# Patient Record
Sex: Female | Born: 1960 | Race: Black or African American | Hispanic: No | Marital: Single | Smoking: Current every day smoker
Health system: Southern US, Community
[De-identification: ages and names within clinical notes are randomized; demographics above are authoritative.]

## PROBLEM LIST (undated history)

## (undated) DIAGNOSIS — J349 Unspecified disorder of nose and nasal sinuses: Secondary | ICD-10-CM

## (undated) DIAGNOSIS — E785 Hyperlipidemia, unspecified: Secondary | ICD-10-CM

## (undated) DIAGNOSIS — I10 Essential (primary) hypertension: Secondary | ICD-10-CM

## (undated) HISTORY — DX: Unspecified disorder of nose and nasal sinuses: J34.9

## (undated) HISTORY — DX: Essential (primary) hypertension: I10

## (undated) HISTORY — DX: Hyperlipidemia, unspecified: E78.5

## (undated) HISTORY — PX: KNEE ARTHROSCOPY: SUR90

---

## 1999-03-09 HISTORY — PX: CHOLECYSTECTOMY: SHX55

## 2005-02-22 ENCOUNTER — Other Ambulatory Visit: Payer: Self-pay

## 2005-02-25 ENCOUNTER — Ambulatory Visit: Payer: Self-pay | Admitting: Unknown Physician Specialty

## 2005-02-27 ENCOUNTER — Emergency Department: Payer: Self-pay | Admitting: Unknown Physician Specialty

## 2011-08-23 ENCOUNTER — Encounter: Payer: Self-pay | Admitting: Family Medicine

## 2011-08-23 ENCOUNTER — Ambulatory Visit (INDEPENDENT_AMBULATORY_CARE_PROVIDER_SITE_OTHER): Payer: BC Managed Care – PPO | Admitting: Family Medicine

## 2011-08-23 VITALS — BP 132/80 | HR 77 | Resp 18 | Ht 61.0 in | Wt 196.0 lb

## 2011-08-23 DIAGNOSIS — E785 Hyperlipidemia, unspecified: Secondary | ICD-10-CM

## 2011-08-23 DIAGNOSIS — Z130 Encounter for screening for diseases of the blood and blood-forming organs and certain disorders involving the immune mechanism: Secondary | ICD-10-CM

## 2011-08-23 DIAGNOSIS — Z1321 Encounter for screening for nutritional disorder: Secondary | ICD-10-CM

## 2011-08-23 DIAGNOSIS — F17200 Nicotine dependence, unspecified, uncomplicated: Secondary | ICD-10-CM

## 2011-08-23 DIAGNOSIS — E669 Obesity, unspecified: Secondary | ICD-10-CM

## 2011-08-23 DIAGNOSIS — I83893 Varicose veins of bilateral lower extremities with other complications: Secondary | ICD-10-CM

## 2011-08-23 DIAGNOSIS — M79606 Pain in leg, unspecified: Secondary | ICD-10-CM

## 2011-08-23 DIAGNOSIS — Z13228 Encounter for screening for other metabolic disorders: Secondary | ICD-10-CM

## 2011-08-23 DIAGNOSIS — Z720 Tobacco use: Secondary | ICD-10-CM

## 2011-08-23 DIAGNOSIS — M79609 Pain in unspecified limb: Secondary | ICD-10-CM

## 2011-08-23 DIAGNOSIS — I83899 Varicose veins of unspecified lower extremities with other complications: Secondary | ICD-10-CM

## 2011-08-23 DIAGNOSIS — I10 Essential (primary) hypertension: Secondary | ICD-10-CM

## 2011-08-23 MED ORDER — HYDROCHLOROTHIAZIDE 25 MG PO TABS: 25.0000 mg | ORAL_TABLET | Freq: Every day | ORAL | Status: DC

## 2011-08-23 NOTE — Patient Instructions (Signed)
Continue the HCTZ for blood pressure and swelling Get the labs done fasting before your next visit Schedule physical in 4 Weeks on Monday

## 2011-08-23 NOTE — Progress Notes (Signed)
  Subjective:    Patient ID: Kelly Conway, female    DOB: [birth year], 51 y.o.   MRN: [id number]  HPI  Patient here to establish care. Previous PCP Dr.Fuentes at [facility].  No Colonscopy Last PAP Smear 2 years ago Overdue for Mammogram Has 10 and 71 y.o. Sons Has an appt with Vein clinic for a screen on July 2nd , leg pains and swelling in right leg for many months Dysphagia for past 6-7 months- worse with heavy meals, feels like foods getting stuck occasionally has heartburn  HTN- she does not take her medication a regular basis was prescribed hydrochlorothiazide as well as losartan. She still has the bottle from March which she still takes.   Review of Systems  GEN- denies fatigue, fever, weight loss,weakness, recent illness HEENT- denies eye drainage, change in vision, nasal discharge, CVS- denies chest pain, palpitations RESP- denies SOB, cough, wheeze ABD- denies N/V, change in stools, abd pain GU- denies dysuria, hematuria, dribbling, incontinence MSK- + joint pain, +muscle aches, injury Neuro- denies headache, dizziness, syncope, seizure activity      Objective:   Physical Exam GEN- NAD, alert and oriented x3 HEENT- PERRL, EOMI, non injected sclera, pink conjunctiva, MMM, oropharynx clear Neck- Supple, no thyromegaly CVS- RRR, no murmur RESP-CTAB EXT- No edema, 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

## 2011-08-24 DIAGNOSIS — E785 Hyperlipidemia, unspecified: Secondary | ICD-10-CM | POA: Insufficient documentation

## 2011-08-24 DIAGNOSIS — E669 Obesity, unspecified: Secondary | ICD-10-CM | POA: Insufficient documentation

## 2011-08-24 DIAGNOSIS — Z720 Tobacco use: Secondary | ICD-10-CM | POA: Insufficient documentation

## 2011-08-24 DIAGNOSIS — I83899 Varicose veins of unspecified lower extremities with other complications: Secondary | ICD-10-CM | POA: Insufficient documentation

## 2011-08-24 DIAGNOSIS — I10 Essential (primary) hypertension: Secondary | ICD-10-CM | POA: Insufficient documentation

## 2011-08-24 DIAGNOSIS — M79606 Pain in leg, unspecified: Secondary | ICD-10-CM | POA: Insufficient documentation

## 2011-08-24 NOTE — Assessment & Plan Note (Signed)
Check fasting lipid panel continue Crestor

## 2011-08-24 NOTE — Assessment & Plan Note (Addendum)
She has an appt for a free screening with a vein specialist in [place] I will let her see them first. I think her leg pain soma for swelling may be due to this large torturous veins that she has.

## 2011-08-24 NOTE — Assessment & Plan Note (Signed)
Per above, her pain is in the region of her varicose vein

## 2011-08-24 NOTE — Assessment & Plan Note (Signed)
Discussed tobacco cessation 

## 2011-08-24 NOTE — Assessment & Plan Note (Signed)
Blood pressure looks okay today even though she has not taken medication a regular basis. I will discontinue the  Cozaar and keep her on hydrochlorothiazide with her leg swelling.

## 2011-09-20 ENCOUNTER — Encounter: Payer: BC Managed Care – PPO | Admitting: Family Medicine

## 2011-09-27 ENCOUNTER — Encounter: Payer: Self-pay | Admitting: Family Medicine

## 2011-09-27 ENCOUNTER — Other Ambulatory Visit: Payer: Self-pay | Admitting: Family Medicine

## 2011-09-27 ENCOUNTER — Other Ambulatory Visit (HOSPITAL_COMMUNITY)
Admission: RE | Admit: 2011-09-27 | Discharge: 2011-09-27 | Disposition: A | Discharge status: Home / Self Care | Payer: BC Managed Care – PPO | Source: Ambulatory Visit | Attending: Family Medicine | Admitting: Family Medicine

## 2011-09-27 ENCOUNTER — Ambulatory Visit (INDEPENDENT_AMBULATORY_CARE_PROVIDER_SITE_OTHER): Payer: BC Managed Care – PPO | Admitting: Family Medicine

## 2011-09-27 VITALS — BP 138/94 | HR 74 | Resp 16 | Ht 61.0 in | Wt 195.0 lb

## 2011-09-27 DIAGNOSIS — R7303 Prediabetes: Secondary | ICD-10-CM | POA: Insufficient documentation

## 2011-09-27 DIAGNOSIS — E669 Obesity, unspecified: Secondary | ICD-10-CM

## 2011-09-27 DIAGNOSIS — F17200 Nicotine dependence, unspecified, uncomplicated: Secondary | ICD-10-CM

## 2011-09-27 DIAGNOSIS — Z0000 Encounter for general adult medical examination without abnormal findings: Secondary | ICD-10-CM

## 2011-09-27 DIAGNOSIS — Z720 Tobacco use: Secondary | ICD-10-CM

## 2011-09-27 DIAGNOSIS — I10 Essential (primary) hypertension: Secondary | ICD-10-CM

## 2011-09-27 DIAGNOSIS — Z23 Encounter for immunization: Secondary | ICD-10-CM

## 2011-09-27 DIAGNOSIS — R1310 Dysphagia, unspecified: Secondary | ICD-10-CM

## 2011-09-27 DIAGNOSIS — R7309 Other abnormal glucose: Secondary | ICD-10-CM

## 2011-09-27 DIAGNOSIS — Z1239 Encounter for other screening for malignant neoplasm of breast: Secondary | ICD-10-CM

## 2011-09-27 DIAGNOSIS — E785 Hyperlipidemia, unspecified: Secondary | ICD-10-CM

## 2011-09-27 DIAGNOSIS — Z01419 Encounter for gynecological examination (general) (routine) without abnormal findings: Secondary | ICD-10-CM | POA: Insufficient documentation

## 2011-09-27 DIAGNOSIS — Z1211 Encounter for screening for malignant neoplasm of colon: Secondary | ICD-10-CM

## 2011-09-27 LAB — COMPREHENSIVE METABOLIC PANEL
AST: 13 U/L (ref 0–37)
Albumin: 3.8 g/dL (ref 3.5–5.2)
BUN: 11 mg/dL (ref 6–23)
CO2: 26 mEq/L (ref 19–32)
Calcium: 9.1 mg/dL (ref 8.4–10.5)
Chloride: 108 mEq/L (ref 96–112)
Creat: 0.83 mg/dL (ref 0.50–1.10)
Glucose, Bld: 94 mg/dL (ref 70–99)

## 2011-09-27 LAB — CBC WITH DIFFERENTIAL/PLATELET
HCT: 43.4 % (ref 36.0–46.0)
Hemoglobin: 15.6 g/dL — ABNORMAL HIGH (ref 12.0–15.0)
Lymphocytes Relative: 33 % (ref 12–46)
Monocytes Absolute: 0.5 10*3/uL (ref 0.1–1.0)
Monocytes Relative: 8 % (ref 3–12)
Neutro Abs: 3.7 10*3/uL (ref 1.7–7.7)
Neutrophils Relative %: 55 % (ref 43–77)
RBC: 4.72 MIL/uL (ref 3.87–5.11)
WBC: 6.6 10*3/uL (ref 4.0–10.5)

## 2011-09-27 LAB — LIPID PANEL
Cholesterol: 213 mg/dL — ABNORMAL HIGH (ref 0–200)
Triglycerides: 65 mg/dL (ref <150)
VLDL: 13 mg/dL (ref 0–40)

## 2011-09-27 NOTE — Assessment & Plan Note (Addendum)
Will restart crestor once labs reviewed, her previous LDL Was 189 in Feb 2013

## 2011-09-27 NOTE — Assessment & Plan Note (Signed)
Reiterated importance of taking medication every day , f/u labs

## 2011-09-27 NOTE — Assessment & Plan Note (Signed)
A1C 6.2% in Feb 2013, will recheck

## 2011-09-27 NOTE — Progress Notes (Signed)
  Subjective:    Patient ID: Kelly Conway, female    DOB: 1960-03-23, 51 y.o.   MRN: [id number]  HPI  Pt here for CPE- last PAP Smear 2 years ago, continues to have menstrual cycle No Mammogram in 20 years Due for screening colonoscopy  No concerns Has eye appt next week, will f/u with vascular next week   Review of Systems   GEN- denies fatigue, fever, weight loss,weakness, recent illness HEENT- denies eye drainage, change in vision, nasal discharge, CVS- denies chest pain, palpitations RESP- denies SOB, cough, wheeze ABD- denies N/V, change in stools, abd pain GU- denies dysuria, hematuria, dribbling, incontinence MSK- denies joint pain, muscle aches, injury Neuro- denies headache, dizziness, syncope, seizure activity      Objective:   Physical Exam GEN- NAD, alert and oriented x3, obese  CVS-RRR, no murmur RESP-CTAB ABD-Soft, NT,ND Breast- normal symmetry, no nipple inversion,no nipple drainage, no nodules or lumps felt Nodes- no axillary nodes GU- normal external genitalia, vaginal mucosa pink and moist, cervix visualized no growth, mild blood form os,  thin white discharge, no CMT, no ovarian masses, uterus normal size, urethra normal, no bladder prolapse  Ext- large varicose vein right leg, no edema Pulses- 2+        Assessment & Plan:   CPE- PAP Smear done, Mammogram to be done, referral to GI for colonoscopy    TDAP given, Eye appt scheduled

## 2011-09-27 NOTE — Assessment & Plan Note (Signed)
Pt down to 1/2 ppd , using nicorette gum as well

## 2011-09-27 NOTE — Assessment & Plan Note (Signed)
Discussed exercise, watching diet, cutting back on Pepsi to 1 a day

## 2011-09-27 NOTE — Patient Instructions (Addendum)
I recommend eye visit once a year I recommend dental visit every 6 months Goal is to  Exercise 30 minutes 5 days a week We will send a letter with PAP results  Please get the labs done- we will call with results I will call and let you know how much crestor to use Take the HCTZ once a day for blood pressure TDAP ( tetanus) given today Please schedule your Mammogram F/U 3 months

## 2011-09-27 NOTE — Assessment & Plan Note (Signed)
Discussed on our initial visit, refer to GI with her screening colonoscopy

## 2011-09-28 LAB — TSH: TSH: 0.441 u[IU]/mL (ref 0.350–4.500)

## 2011-09-28 LAB — HEMOGLOBIN A1C
Hgb A1c MFr Bld: 5.8 % — ABNORMAL HIGH (ref <5.7)
Mean Plasma Glucose: 120 mg/dL — ABNORMAL HIGH (ref <117)

## 2011-09-29 LAB — VITAMIN D 1,25 DIHYDROXY
Vitamin D 1, 25 (OH)2 Total: 65 pg/mL (ref 18–72)
Vitamin D3 1, 25 (OH)2: 65 pg/mL

## 2011-10-11 ENCOUNTER — Telehealth: Payer: Self-pay | Admitting: *Deleted

## 2011-10-11 ENCOUNTER — Ambulatory Visit: Payer: BC Managed Care – PPO | Admitting: Urgent Care

## 2011-10-11 NOTE — Telephone Encounter (Signed)
Dr Deirdre Peer office is aware

## 2011-10-11 NOTE — Telephone Encounter (Signed)
Noted, please let PCP know Thanks

## 2011-10-11 NOTE — Telephone Encounter (Signed)
Pt was a no show

## 2012-12-18 ENCOUNTER — Emergency Department: Payer: Self-pay | Admitting: Emergency Medicine

## 2012-12-18 LAB — COMPREHENSIVE METABOLIC PANEL
Anion Gap: 10 (ref 7–16)
BUN: 7 mg/dL (ref 7–18)
Bilirubin,Total: 0.8 mg/dL (ref 0.2–1.0)
Chloride: 103 mmol/L (ref 98–107)
Co2: 21 mmol/L (ref 21–32)
Creatinine: 0.89 mg/dL (ref 0.60–1.30)
Osmolality: 268 (ref 275–301)
Potassium: 3.1 mmol/L — ABNORMAL LOW (ref 3.5–5.1)
SGOT(AST): 12 U/L — ABNORMAL LOW (ref 15–37)
Sodium: 134 mmol/L — ABNORMAL LOW (ref 136–145)

## 2012-12-18 LAB — LIPASE, BLOOD: Lipase: 66 U/L — ABNORMAL LOW (ref 73–393)

## 2012-12-18 LAB — CBC
HCT: 44.6 % (ref 35.0–47.0)
HGB: 15.3 g/dL (ref 12.0–16.0)
MCV: 95 fL (ref 80–100)
RBC: 4.7 10*6/uL (ref 3.80–5.20)
RDW: 13.6 % (ref 11.5–14.5)

## 2013-05-15 ENCOUNTER — Other Ambulatory Visit: Payer: Self-pay

## 2013-05-15 ENCOUNTER — Encounter (HOSPITAL_COMMUNITY): Payer: Self-pay | Admitting: Emergency Medicine

## 2013-05-15 ENCOUNTER — Emergency Department (HOSPITAL_COMMUNITY)
Admission: EM | Admit: 2013-05-15 | Discharge: 2013-05-15 | Disposition: A | Discharge status: Home / Self Care | Payer: No Typology Code available for payment source | Attending: Emergency Medicine | Admitting: Emergency Medicine

## 2013-05-15 ENCOUNTER — Emergency Department (HOSPITAL_COMMUNITY): Payer: No Typology Code available for payment source

## 2013-05-15 DIAGNOSIS — Z8639 Personal history of other endocrine, nutritional and metabolic disease: Secondary | ICD-10-CM | POA: Insufficient documentation

## 2013-05-15 DIAGNOSIS — F17200 Nicotine dependence, unspecified, uncomplicated: Secondary | ICD-10-CM | POA: Insufficient documentation

## 2013-05-15 DIAGNOSIS — Z8709 Personal history of other diseases of the respiratory system: Secondary | ICD-10-CM | POA: Insufficient documentation

## 2013-05-15 DIAGNOSIS — R51 Headache: Secondary | ICD-10-CM | POA: Insufficient documentation

## 2013-05-15 DIAGNOSIS — R0789 Other chest pain: Secondary | ICD-10-CM | POA: Insufficient documentation

## 2013-05-15 DIAGNOSIS — R079 Chest pain, unspecified: Secondary | ICD-10-CM

## 2013-05-15 DIAGNOSIS — Z862 Personal history of diseases of the blood and blood-forming organs and certain disorders involving the immune mechanism: Secondary | ICD-10-CM | POA: Insufficient documentation

## 2013-05-15 DIAGNOSIS — I10 Essential (primary) hypertension: Secondary | ICD-10-CM | POA: Insufficient documentation

## 2013-05-15 DIAGNOSIS — Z79899 Other long term (current) drug therapy: Secondary | ICD-10-CM | POA: Insufficient documentation

## 2013-05-15 LAB — BASIC METABOLIC PANEL
BUN: 11 mg/dL (ref 6–23)
CO2: 23 mEq/L (ref 19–32)
Calcium: 9.4 mg/dL (ref 8.4–10.5)
Chloride: 103 mEq/L (ref 96–112)
Creatinine, Ser: 0.69 mg/dL (ref 0.50–1.10)
GFR calc Af Amer: >90 mL/min (ref >90)
GFR calc non Af Amer: >90 mL/min (ref >90)
Glucose, Bld: 94 mg/dL (ref 70–99)
Potassium: 4 mEq/L (ref 3.7–5.3)
Sodium: 140 mEq/L (ref 137–147)

## 2013-05-15 LAB — CBC WITH DIFFERENTIAL/PLATELET
Basophils Absolute: 0 10*3/uL (ref 0.0–0.1)
Basophils Relative: 0 % (ref 0–1)
Eosinophils Absolute: 0.1 10*3/uL (ref 0.0–0.7)
Eosinophils Relative: 1 % (ref 0–5)
HCT: 47.3 % — ABNORMAL HIGH (ref 36.0–46.0)
Hemoglobin: 16.4 g/dL — ABNORMAL HIGH (ref 12.0–15.0)
Lymphocytes Relative: 27 % (ref 12–46)
Lymphs Abs: 2.2 10*3/uL (ref 0.7–4.0)
MCH: 32.9 pg (ref 26.0–34.0)
MCHC: 34.7 g/dL (ref 30.0–36.0)
MCV: 95 fL (ref 78.0–100.0)
Monocytes Absolute: 0.5 10*3/uL (ref 0.1–1.0)
Monocytes Relative: 6 % (ref 3–12)
Neutro Abs: 5.3 10*3/uL (ref 1.7–7.7)
Neutrophils Relative %: 66 % (ref 43–77)
Platelets: 171 10*3/uL (ref 150–400)
RBC: 4.98 MIL/uL (ref 3.87–5.11)
RDW: 14 % (ref 11.5–15.5)
WBC: 8.1 10*3/uL (ref 4.0–10.5)

## 2013-05-15 LAB — URINALYSIS, ROUTINE W REFLEX MICROSCOPIC
Bilirubin Urine: NEGATIVE
Glucose, UA: NEGATIVE mg/dL
Hgb urine dipstick: NEGATIVE
Ketones, ur: NEGATIVE mg/dL
Leukocytes, UA: NEGATIVE
Nitrite: NEGATIVE
Protein, ur: NEGATIVE mg/dL
Specific Gravity, Urine: 1.025 (ref 1.005–1.030)
Urobilinogen, UA: 0.2 mg/dL (ref 0.0–1.0)
pH: 6 (ref 5.0–8.0)

## 2013-05-15 LAB — TROPONIN I: Troponin I: <0.3 ng/mL (ref <0.30)

## 2013-05-15 MED ORDER — LOSARTAN POTASSIUM 25 MG PO TABS: 25.0000 mg | ORAL_TABLET | Freq: Every day | ORAL | Status: AC

## 2013-05-15 MED ORDER — HYDROCHLOROTHIAZIDE 25 MG PO TABS: 25.0000 mg | ORAL_TABLET | Freq: Every day | ORAL | Status: AC

## 2013-05-15 NOTE — ED Notes (Signed)
Pt c/o headache for several days and new onset chest pain this morning. States CP is sharp in nature and non-radiating. States she knows her BP is high and has prescription BP medication but she does not take it.

## 2013-05-15 NOTE — ED Notes (Signed)
Pt c/o headache, blurry vision, left side chest pain described as sharp that started today, denies any sob today, states that she was sob a few days ago, denies any n/v, diaphoresis,

## 2013-05-15 NOTE — Discharge Instructions (Signed)
Chest Pain (Nonspecific) °It is often hard to give a specific diagnosis for the cause of chest pain. There is always a chance that your pain could be related to something serious, such as a heart attack or a blood clot in the lungs. You need to follow up with your caregiver for further evaluation. °CAUSES  °· Heartburn. °· Pneumonia or bronchitis. °· Anxiety or stress. °· Inflammation around your heart (pericarditis) or lung (pleuritis or pleurisy). °· A blood clot in the lung. °· A collapsed lung (pneumothorax). It can develop suddenly on its own (spontaneous pneumothorax) or from injury (trauma) to the chest. °· Shingles infection (herpes zoster virus). °The chest wall is composed of bones, muscles, and cartilage. Any of these can be the source of the pain. °· The bones can be bruised by injury. °· The muscles or cartilage can be strained by coughing or overwork. °· The cartilage can be affected by inflammation and become sore (costochondritis). °DIAGNOSIS  °Lab tests or other studies, such as X-rays, electrocardiography, stress testing, or cardiac imaging, may be needed to find the cause of your pain.  °TREATMENT  °· Treatment depends on what may be causing your chest pain. Treatment may include: °· Acid blockers for heartburn. °· Anti-inflammatory medicine. °· Pain medicine for inflammatory conditions. °· Antibiotics if an infection is present. °· You may be advised to change lifestyle habits. This includes stopping smoking and avoiding alcohol, caffeine, and chocolate. °· You may be advised to keep your head raised (elevated) when sleeping. This reduces the chance of acid going backward from your stomach into your esophagus. °· Most of the time, nonspecific chest pain will improve within 2 to 3 days with rest and mild pain medicine. °HOME CARE INSTRUCTIONS  °· If antibiotics were prescribed, take your antibiotics as directed. Finish them even if you start to feel better. °· For the next few days, avoid physical  activities that bring on chest pain. Continue physical activities as directed. °· Do not smoke. °· Avoid drinking alcohol. °· Only take over-the-counter or prescription medicine for pain, discomfort, or fever as directed by your caregiver. °· Follow your caregiver's suggestions for further testing if your chest pain does not go away. °· Keep any follow-up appointments you made. If you do not go to an appointment, you could develop lasting (chronic) problems with pain. If there is any problem keeping an appointment, you must call to reschedule. °SEEK MEDICAL CARE IF:  °· You think you are having problems from the medicine you are taking. Read your medicine instructions carefully. °· Your chest pain does not go away, even after treatment. °· You develop a rash with blisters on your chest. °SEEK IMMEDIATE MEDICAL CARE IF:  °· You have increased chest pain or pain that spreads to your arm, neck, jaw, back, or abdomen. °· You develop shortness of breath, an increasing cough, or you are coughing up blood. °· You have severe back or abdominal pain, feel nauseous, or vomit. °· You develop severe weakness, fainting, or chills. °· You have a fever. °THIS IS AN EMERGENCY. Do not wait to see if the pain will go away. Get medical help at once. Call your local emergency services (911 in [place] Do not drive yourself to the hospital. °MAKE SURE YOU:  °· Understand these instructions. °· Will watch your condition. °· Will get help right away if you are not doing well or get worse. °Document Released: 12/02/2004 Document Revised: 05/17/2011 Document Reviewed: 09/28/2007 °[employer]® Patient Information ©2014 [employer]. ° °

## 2013-05-22 NOTE — ED Provider Notes (Signed)
CSN: [id number]     Arrival date & time 05/15/13  1431 History   First MD Initiated Contact with Patient 05/15/13 1500     Chief Complaint  Patient presents with  . Chest Pain     (Consider location/radiation/quality/duration/timing/severity/associated sxs/prior Treatment) HPI 53 year old female with multiple complaints, the chief of which seems to be chest pain. Onset this morning. First dose she woke up. He is sharp in nature in the center of her chest. Does not radiate. Has been constant. No appreciable exacerbating or relieving factors. Additionally, she is headache diffuse headache for the past several days. Denies any trauma. No fevers or chills. No neck pain. Visual complaints. No acute numbness, tingling or loss of strength. No shortness of breath. No cough. No unusual leg pain or swelling. No nausea or diaphoresis. No history of similar complaints.  Past Medical History  Diagnosis Date  . Hyperlipidemia   . Hypertension   . Sinus disease    Past Surgical History  Procedure Laterality Date  . Knee arthroscopy      right  . Cholecystectomy  2001   Family History  Problem Relation Age of Onset  . Hypertension Mother   . Hyperlipidemia Mother    History  Substance Use Topics  . Smoking status: Current Every Day Smoker    Types: Cigarettes  . Smokeless tobacco: Not on file  . Alcohol Use: No   OB History   Grav Para Term Preterm Abortions TAB SAB Ect Mult Living                 Review of Systems  All systems reviewed and negative, other than as noted in HPI.   Allergies  Codeine  Home Medications   Current Outpatient Rx  Name  Route  Sig  Dispense  Refill  . Aspirin-Salicylamide-Caffeine (BC HEADACHE) 325-95-16 MG TABS   Oral   Take 1 packet by mouth daily.         . hydrochlorothiazide (HYDRODIURIL) 25 MG tablet   Oral   Take 1 tablet (25 mg total) by mouth daily.   30 tablet   0   . losartan (COZAAR) 25 MG tablet   Oral   Take 1 tablet (25 mg  total) by mouth daily.   30 tablet   0    BP 170/96  Pulse 74  Temp(Src) 98.3 F (36.8 C) (Oral)  Resp 19  Ht 5\' 1"  (1.549 m)  Wt 181 lb (82.101 kg)  BMI 34.22 kg/m2  SpO2 99%  LMP 04/21/2013 Physical Exam  Nursing note and vitals reviewed. Constitutional: She appears well-developed and well-nourished. No distress.  HENT:  Head: Normocephalic and atraumatic.  Eyes: Conjunctivae are normal. Right eye exhibits no discharge. Left eye exhibits no discharge.  Neck: Neck supple.  Cardiovascular: Normal rate, regular rhythm and normal heart sounds.  Exam reveals no gallop and no friction rub.   No murmur heard. Pulmonary/Chest: Effort normal and breath sounds normal. No respiratory distress.  Abdominal: Soft. She exhibits no distension. There is no tenderness.  Musculoskeletal: She exhibits no edema and no tenderness.  Lower extremities symmetric as compared to each other. No calf tenderness. Negative Homan's. No palpable cords.   Neurological: She is alert.  Skin: Skin is warm and dry.  Psychiatric: She has a normal mood and affect. Her behavior is normal. Thought content normal.    ED Course  Procedures (including critical care time) Labs Review Labs Reviewed  CBC WITH DIFFERENTIAL - Abnormal; Notable for the  following:    Hemoglobin 16.4 (*)    HCT 47.3 (*)    All other components within normal limits  BASIC METABOLIC PANEL  TROPONIN I  URINALYSIS, ROUTINE W REFLEX MICROSCOPIC   Imaging Review No results found. Dg Chest 2 View  05/15/2013   CLINICAL DATA Headache and chest pain since this morning  EXAM CHEST  2 VIEW  COMPARISON None.  FINDINGS The heart size and mediastinal contours are within normal limits. Both lungs are clear. The visualized skeletal structures are unremarkable.  IMPRESSION No active cardiopulmonary disease.  SIGNATURE  Electronically Signed   By: Esperanza Heiraymond  Rubner M.D.   On: 05/15/2013 15:41    EKG Interpretation None     EKG:  Rhythm: normal  sinus Vent. rate 80 BPM PR interval 174 ms QRS duration 72 ms QT/QTc 376/433 ms ST segments: nsst changes Comparison: none  MDM   Final diagnoses:  Chest pain    53 year old female with chest pain. Atypical for ACS. Doubt infectious, pulmonary embolism or dissection. EKG nonspecific. Workup otherwise pretty unremarkable. Low suspicion for emergent process. Return precautions were discussed. Outpatient followup otherwise.    Raeford RazorStephen Elyas Villamor, MD 05/22/13 2153

## 2015-02-23 IMAGING — CR DG CHEST 2V
2 series · 2 of 2 positions shown · non-contrast
Comparison: none

[view not recorded (1 of 2)]
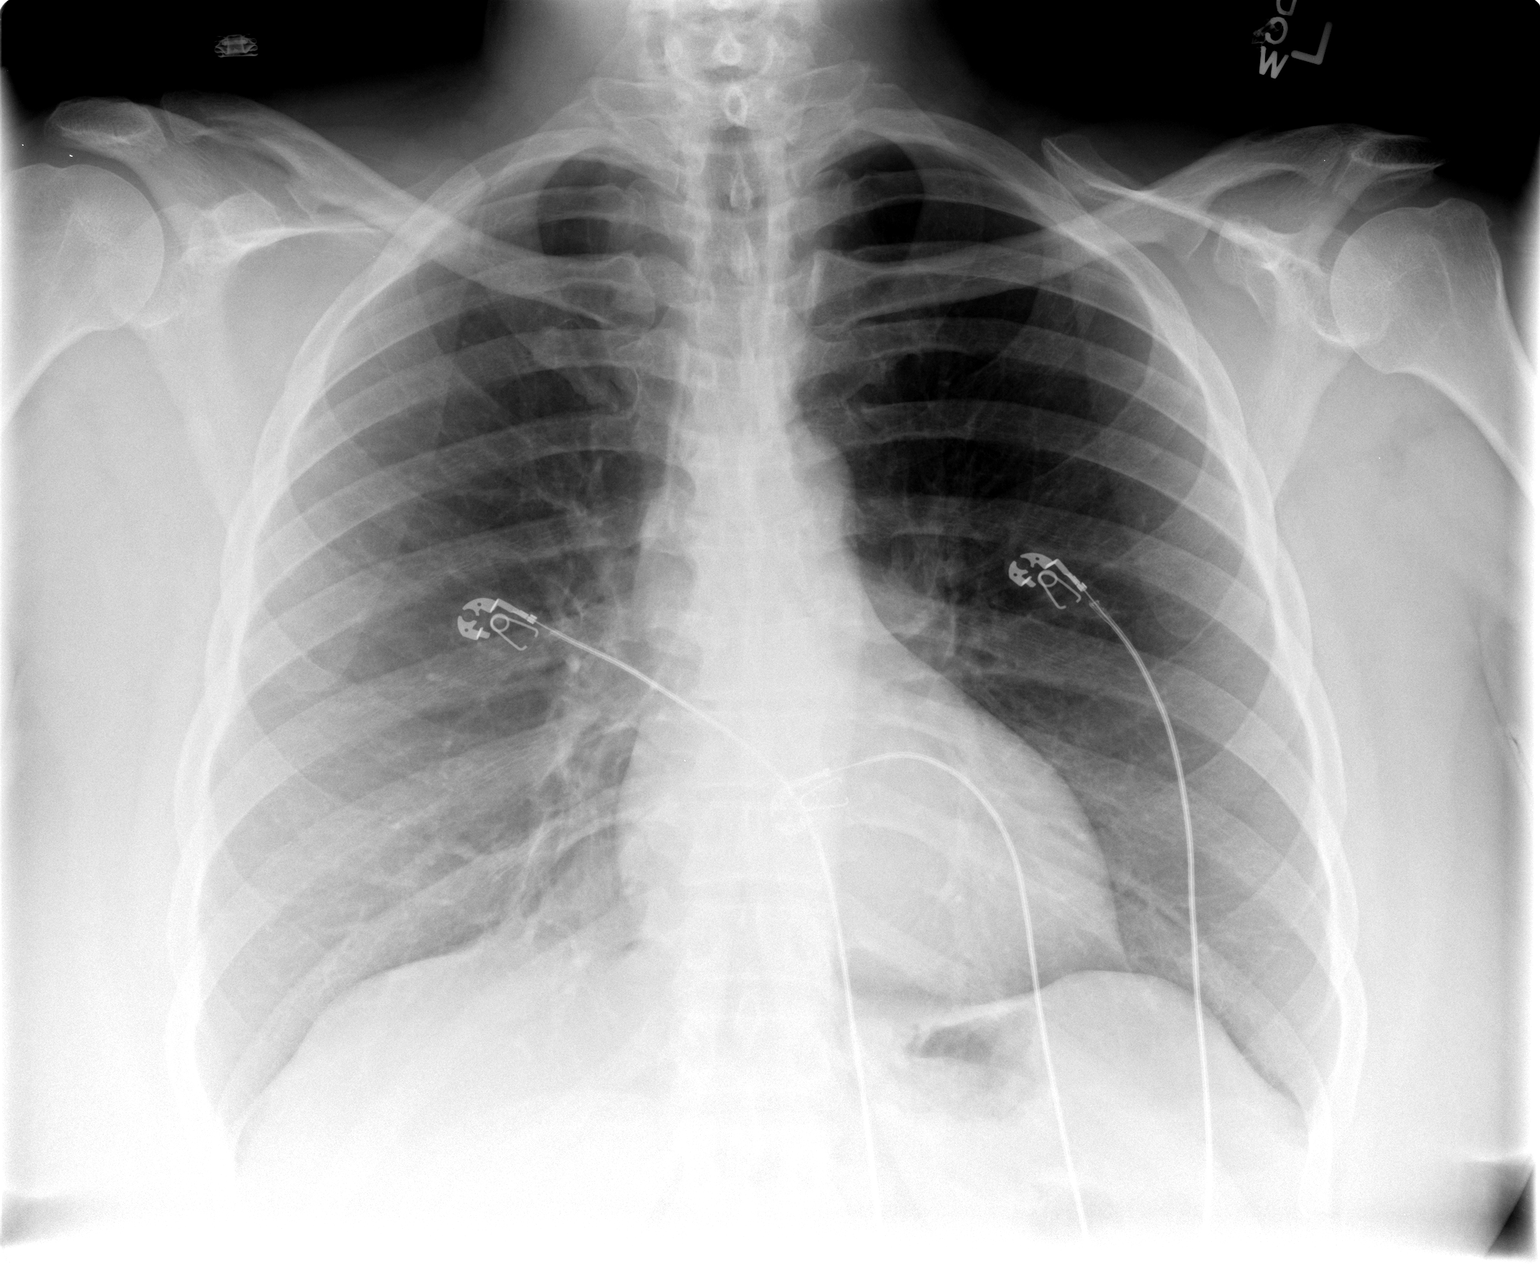

[view not recorded (2 of 2)]
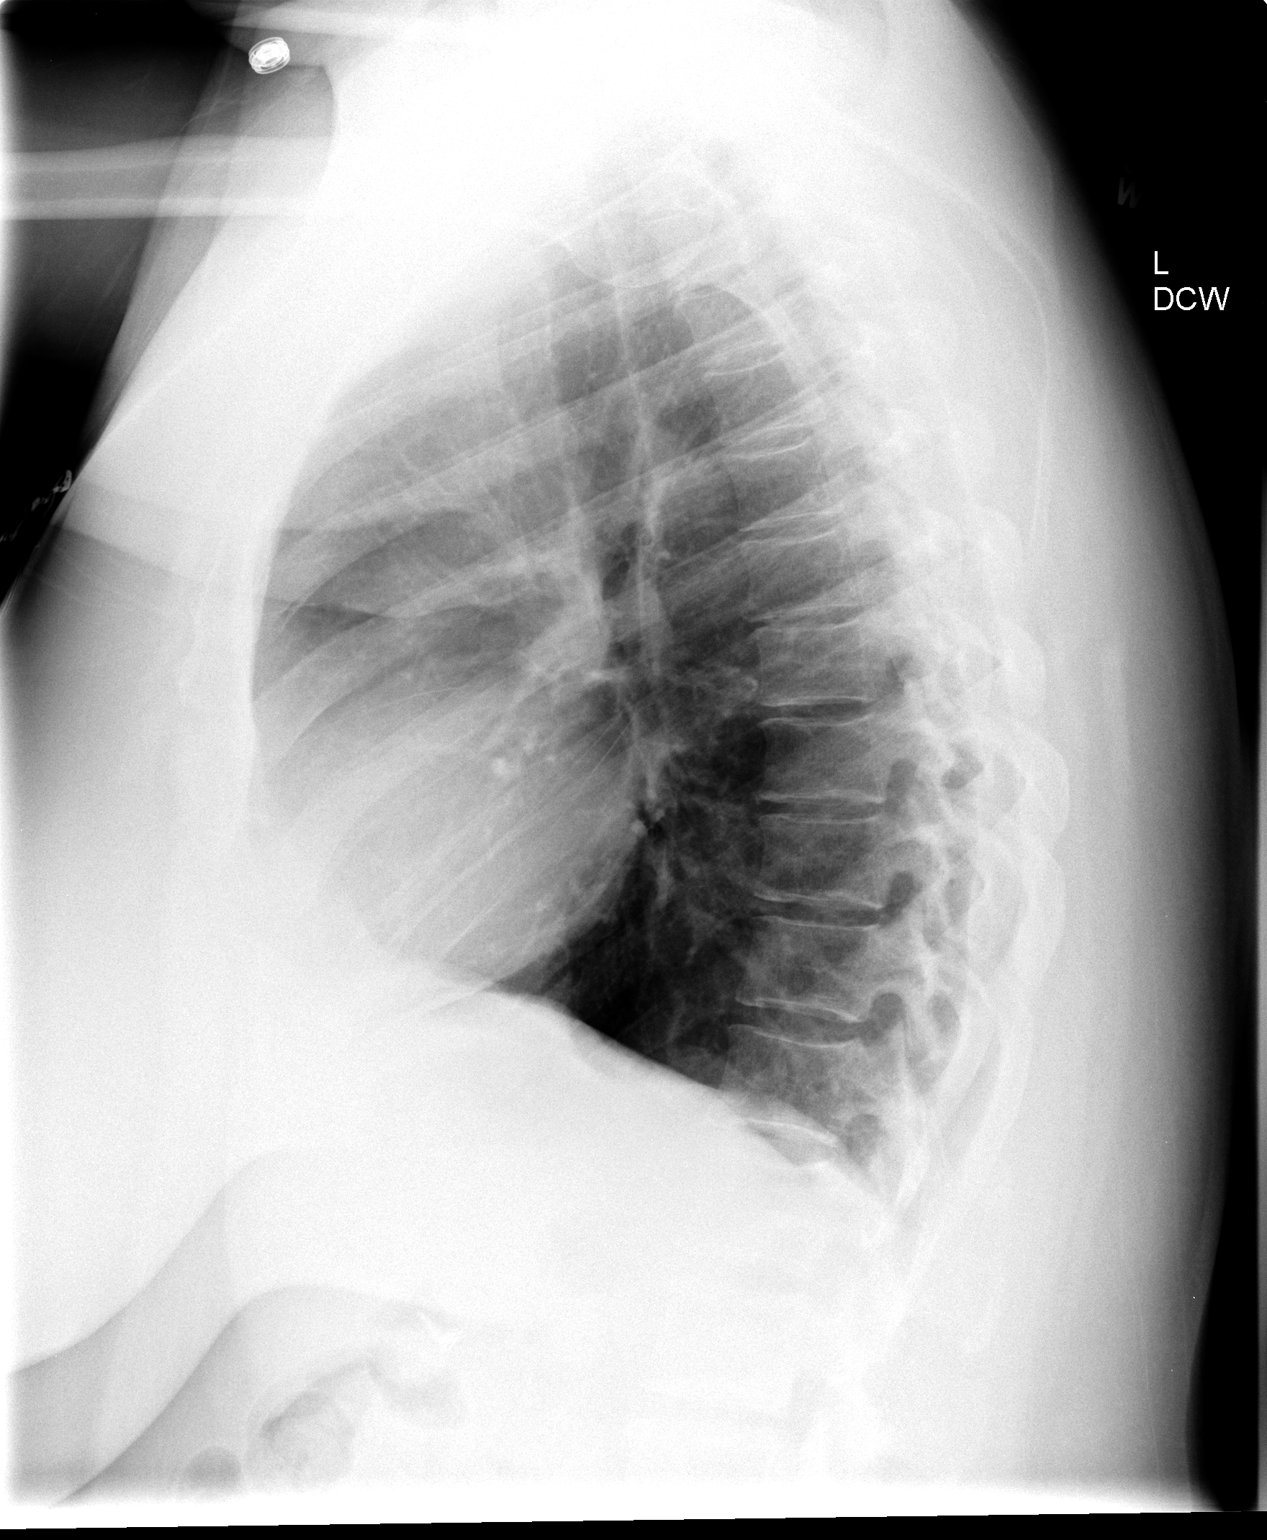

[2 of 2 positions shown; findings below may reference images not displayed]

CLINICAL DATA
Headache and chest pain since this morning

EXAM
CHEST  2 VIEW

COMPARISON
None.

FINDINGS
The heart size and mediastinal contours are within normal limits.
Both lungs are clear. The visualized skeletal structures are
unremarkable.

IMPRESSION
No active cardiopulmonary disease.

SIGNATURE
